# Patient Record
Sex: Female | Born: 1965 | Race: White | Hispanic: No | Marital: Single | Smoking: Never smoker
Health system: Southern US, Community
[De-identification: ages and names within clinical notes are randomized; demographics above are authoritative.]

## PROBLEM LIST (undated history)

## (undated) DIAGNOSIS — J45909 Unspecified asthma, uncomplicated: Secondary | ICD-10-CM

## (undated) DIAGNOSIS — E039 Hypothyroidism, unspecified: Secondary | ICD-10-CM

## (undated) DIAGNOSIS — S63649A Sprain of metacarpophalangeal joint of unspecified thumb, initial encounter: Secondary | ICD-10-CM

## (undated) HISTORY — PX: ANTERIOR CRUCIATE LIGAMENT REPAIR: SHX115

---

## 2013-07-14 ENCOUNTER — Other Ambulatory Visit: Payer: Self-pay

## 2013-07-14 DIAGNOSIS — Z1231 Encounter for screening mammogram for malignant neoplasm of breast: Secondary | ICD-10-CM

## 2013-07-28 ENCOUNTER — Ambulatory Visit: Payer: Self-pay

## 2013-08-10 ENCOUNTER — Ambulatory Visit
Admission: RE | Admit: 2013-08-10 | Discharge: 2013-08-10 | Disposition: A | Discharge status: Home / Self Care | Payer: BC Managed Care – PPO | Source: Ambulatory Visit

## 2013-08-10 DIAGNOSIS — Z1231 Encounter for screening mammogram for malignant neoplasm of breast: Secondary | ICD-10-CM

## 2013-08-19 ENCOUNTER — Other Ambulatory Visit: Payer: Self-pay | Admitting: Family Medicine

## 2013-08-19 DIAGNOSIS — R928 Other abnormal and inconclusive findings on diagnostic imaging of breast: Secondary | ICD-10-CM

## 2013-08-26 ENCOUNTER — Ambulatory Visit
Admission: RE | Admit: 2013-08-26 | Discharge: 2013-08-26 | Disposition: A | Discharge status: Home / Self Care | Payer: BC Managed Care – PPO | Source: Ambulatory Visit | Attending: Family Medicine | Admitting: Family Medicine

## 2013-08-26 DIAGNOSIS — R928 Other abnormal and inconclusive findings on diagnostic imaging of breast: Secondary | ICD-10-CM

## 2014-07-29 ENCOUNTER — Other Ambulatory Visit: Payer: Self-pay

## 2014-07-29 DIAGNOSIS — Z1231 Encounter for screening mammogram for malignant neoplasm of breast: Secondary | ICD-10-CM

## 2014-09-13 ENCOUNTER — Ambulatory Visit
Admission: RE | Admit: 2014-09-13 | Discharge: 2014-09-13 | Disposition: A | Discharge status: Home / Self Care | Payer: BC Managed Care – PPO | Source: Ambulatory Visit

## 2014-09-13 DIAGNOSIS — Z1231 Encounter for screening mammogram for malignant neoplasm of breast: Secondary | ICD-10-CM

## 2015-10-09 ENCOUNTER — Other Ambulatory Visit: Payer: Self-pay | Admitting: Orthopedic Surgery

## 2015-10-09 ENCOUNTER — Encounter (HOSPITAL_BASED_OUTPATIENT_CLINIC_OR_DEPARTMENT_OTHER): Payer: Self-pay | Admitting: *Deleted

## 2015-10-10 ENCOUNTER — Encounter (HOSPITAL_BASED_OUTPATIENT_CLINIC_OR_DEPARTMENT_OTHER): Payer: Self-pay | Admitting: Orthopedic Surgery

## 2015-10-10 ENCOUNTER — Encounter (HOSPITAL_BASED_OUTPATIENT_CLINIC_OR_DEPARTMENT_OTHER)
Admission: RE | Disposition: A | Discharge status: Home / Self Care | Payer: Self-pay | Source: Ambulatory Visit | Attending: Orthopedic Surgery

## 2015-10-10 ENCOUNTER — Ambulatory Visit (HOSPITAL_BASED_OUTPATIENT_CLINIC_OR_DEPARTMENT_OTHER)
Admission: RE | Admit: 2015-10-10 | Discharge: 2015-10-10 | Disposition: A | Discharge status: Home / Self Care | Payer: BC Managed Care – PPO | Source: Ambulatory Visit | Attending: Orthopedic Surgery | Admitting: Orthopedic Surgery

## 2015-10-10 ENCOUNTER — Ambulatory Visit (HOSPITAL_BASED_OUTPATIENT_CLINIC_OR_DEPARTMENT_OTHER): Payer: BC Managed Care – PPO | Admitting: Anesthesiology

## 2015-10-10 DIAGNOSIS — E039 Hypothyroidism, unspecified: Secondary | ICD-10-CM | POA: Diagnosis not present

## 2015-10-10 DIAGNOSIS — Y9301 Activity, walking, marching and hiking: Secondary | ICD-10-CM | POA: Insufficient documentation

## 2015-10-10 DIAGNOSIS — W19XXXA Unspecified fall, initial encounter: Secondary | ICD-10-CM | POA: Insufficient documentation

## 2015-10-10 DIAGNOSIS — S63642A Sprain of metacarpophalangeal joint of left thumb, initial encounter: Secondary | ICD-10-CM | POA: Insufficient documentation

## 2015-10-10 DIAGNOSIS — J45909 Unspecified asthma, uncomplicated: Secondary | ICD-10-CM | POA: Diagnosis not present

## 2015-10-10 DIAGNOSIS — M1990 Unspecified osteoarthritis, unspecified site: Secondary | ICD-10-CM | POA: Diagnosis not present

## 2015-10-10 HISTORY — DX: Hypothyroidism, unspecified: E03.9

## 2015-10-10 HISTORY — DX: Sprain of metacarpophalangeal joint of unspecified thumb, initial encounter: S63.649A

## 2015-10-10 HISTORY — DX: Unspecified asthma, uncomplicated: J45.909

## 2015-10-10 HISTORY — PX: ULNAR COLLATERAL LIGAMENT REPAIR: SHX6159

## 2015-10-10 SURGERY — REPAIR, LIGAMENT, ULNAR COLLATERAL
Anesthesia: Monitor Anesthesia Care | Site: Thumb | Laterality: Left

## 2015-10-10 MED ORDER — MIDAZOLAM HCL 2 MG/2ML IJ SOLN
1.0000 mg | INTRAMUSCULAR | Status: DC | PRN
  Administered 2015-10-10: 2 mg via INTRAVENOUS

## 2015-10-10 MED ORDER — VANCOMYCIN HCL IN DEXTROSE 1-5 GM/200ML-% IV SOLN
1000.0000 mg | INTRAVENOUS | Status: AC
  Administered 2015-10-10: 1000 mg via INTRAVENOUS

## 2015-10-10 MED ORDER — PROPOFOL 500 MG/50ML IV EMUL
INTRAVENOUS | Status: DC | PRN
  Administered 2015-10-10: 100 ug/kg/min via INTRAVENOUS

## 2015-10-10 MED ORDER — LACTATED RINGERS IV SOLN
INTRAVENOUS | Status: DC
  Administered 2015-10-10: 13:00:00 via INTRAVENOUS

## 2015-10-10 MED ORDER — VANCOMYCIN HCL IN DEXTROSE 1-5 GM/200ML-% IV SOLN
INTRAVENOUS | Status: AC
  Filled 2015-10-10: qty 200

## 2015-10-10 MED ORDER — FENTANYL CITRATE (PF) 100 MCG/2ML IJ SOLN
INTRAMUSCULAR | Status: AC
  Filled 2015-10-10: qty 2

## 2015-10-10 MED ORDER — CHLORHEXIDINE GLUCONATE 4 % EX LIQD: 60.0000 mL | Freq: Once | CUTANEOUS | Status: DC

## 2015-10-10 MED ORDER — HYDROCODONE-ACETAMINOPHEN 10-325 MG PO TABS: 1.0000 | ORAL_TABLET | Freq: Four times a day (QID) | ORAL | Status: AC | PRN

## 2015-10-10 MED ORDER — LIDOCAINE HCL (CARDIAC) 20 MG/ML IV SOLN
INTRAVENOUS | Status: DC | PRN
  Administered 2015-10-10: 50 mg via INTRAVENOUS

## 2015-10-10 MED ORDER — ONDANSETRON HCL 4 MG/2ML IJ SOLN
INTRAMUSCULAR | Status: DC | PRN
  Administered 2015-10-10: 4 mg via INTRAVENOUS

## 2015-10-10 MED ORDER — ONDANSETRON HCL 4 MG/2ML IJ SOLN
INTRAMUSCULAR | Status: AC
  Filled 2015-10-10: qty 2

## 2015-10-10 MED ORDER — MEPERIDINE HCL 25 MG/ML IJ SOLN: 6.2500 mg | INTRAMUSCULAR | Status: DC | PRN

## 2015-10-10 MED ORDER — VANCOMYCIN HCL IN DEXTROSE 1-5 GM/200ML-% IV SOLN: 1000.0000 mg | INTRAVENOUS | Status: DC

## 2015-10-10 MED ORDER — LIDOCAINE HCL (CARDIAC) 20 MG/ML IV SOLN
INTRAVENOUS | Status: AC
  Filled 2015-10-10: qty 5

## 2015-10-10 MED ORDER — OXYCODONE HCL 5 MG/5ML PO SOLN: 5.0000 mg | Freq: Once | ORAL | Status: DC | PRN

## 2015-10-10 MED ORDER — DEXAMETHASONE SODIUM PHOSPHATE 10 MG/ML IJ SOLN
INTRAMUSCULAR | Status: DC | PRN
  Administered 2015-10-10: 10 mg via INTRAVENOUS

## 2015-10-10 MED ORDER — BUPIVACAINE-EPINEPHRINE (PF) 0.5% -1:200000 IJ SOLN
INTRAMUSCULAR | Status: DC | PRN
  Administered 2015-10-10: 30 mL via PERINEURAL

## 2015-10-10 MED ORDER — HYDROMORPHONE HCL 1 MG/ML IJ SOLN: 0.2500 mg | INTRAMUSCULAR | Status: DC | PRN

## 2015-10-10 MED ORDER — GLYCOPYRROLATE 0.2 MG/ML IJ SOLN
0.2000 mg | Freq: Once | INTRAMUSCULAR | Status: DC | PRN
Start: 2015-10-10 — End: 2015-10-10

## 2015-10-10 MED ORDER — DIPHENHYDRAMINE HCL 50 MG/ML IJ SOLN
INTRAMUSCULAR | Status: AC
  Filled 2015-10-10: qty 1

## 2015-10-10 MED ORDER — 0.9 % SODIUM CHLORIDE (POUR BTL) OPTIME
TOPICAL | Status: DC | PRN
  Administered 2015-10-10: 150 mL

## 2015-10-10 MED ORDER — OXYCODONE HCL 5 MG PO TABS: 5.0000 mg | ORAL_TABLET | Freq: Once | ORAL | Status: DC | PRN

## 2015-10-10 MED ORDER — MIDAZOLAM HCL 2 MG/2ML IJ SOLN
INTRAMUSCULAR | Status: AC
  Filled 2015-10-10: qty 2

## 2015-10-10 MED ORDER — FENTANYL CITRATE (PF) 100 MCG/2ML IJ SOLN
50.0000 ug | INTRAMUSCULAR | Status: AC | PRN
  Administered 2015-10-10: 100 ug via INTRAVENOUS
  Administered 2015-10-10 (×2): 50 ug via INTRAVENOUS

## 2015-10-10 MED ORDER — PROMETHAZINE HCL 25 MG/ML IJ SOLN: 6.2500 mg | INTRAMUSCULAR | Status: DC | PRN

## 2015-10-10 MED ORDER — SCOPOLAMINE 1 MG/3DAYS TD PT72: 1.0000 | MEDICATED_PATCH | Freq: Once | TRANSDERMAL | Status: DC | PRN

## 2015-10-10 MED ORDER — DIPHENHYDRAMINE HCL 50 MG/ML IJ SOLN
INTRAMUSCULAR | Status: DC | PRN
  Administered 2015-10-10: 25 mg via INTRAVENOUS

## 2015-10-10 SURGICAL SUPPLY — 71 items
ANCHOR JUGGERKNOT 1.0 1DR 2-0 (Anchor) ×3 IMPLANT
BAG DECANTER FOR FLEXI CONT (MISCELLANEOUS) IMPLANT
BLADE MINI RND TIP GREEN BEAV (BLADE) ×3 IMPLANT
BLADE SURG 15 STRL LF DISP TIS (BLADE) ×1 IMPLANT
BLADE SURG 15 STRL SS (BLADE) ×2
BNDG COHESIVE 3X5 TAN STRL LF (GAUZE/BANDAGES/DRESSINGS) ×3 IMPLANT
BNDG ESMARK 4X9 LF (GAUZE/BANDAGES/DRESSINGS) ×3 IMPLANT
BNDG GAUZE ELAST 4 BULKY (GAUZE/BANDAGES/DRESSINGS) ×3 IMPLANT
CHLORAPREP W/TINT 26ML (MISCELLANEOUS) ×3 IMPLANT
CLOSURE WOUND 1/2 X4 (GAUZE/BANDAGES/DRESSINGS) ×1
CORDS BIPOLAR (ELECTRODE) ×3 IMPLANT
COVER BACK TABLE 60X90IN (DRAPES) ×3 IMPLANT
COVER MAYO STAND STRL (DRAPES) ×3 IMPLANT
CUFF TOURNIQUET SINGLE 18IN (TOURNIQUET CUFF) ×3 IMPLANT
DECANTER SPIKE VIAL GLASS SM (MISCELLANEOUS) ×3 IMPLANT
DRAPE EXTREMITY T 121X128X90 (DRAPE) ×3 IMPLANT
DRAPE OEC MINIVIEW 54X84 (DRAPES) ×3 IMPLANT
DRAPE SURG 17X23 STRL (DRAPES) ×3 IMPLANT
GAUZE SPONGE 4X4 12PLY STRL (GAUZE/BANDAGES/DRESSINGS) ×3 IMPLANT
GAUZE XEROFORM 1X8 LF (GAUZE/BANDAGES/DRESSINGS) ×3 IMPLANT
GLOVE BIO SURGEON STRL SZ 6.5 (GLOVE) ×2 IMPLANT
GLOVE BIO SURGEONS STRL SZ 6.5 (GLOVE) ×1
GLOVE BIOGEL PI IND STRL 7.0 (GLOVE) ×2 IMPLANT
GLOVE BIOGEL PI IND STRL 8.5 (GLOVE) ×1 IMPLANT
GLOVE BIOGEL PI INDICATOR 7.0 (GLOVE) ×4
GLOVE BIOGEL PI INDICATOR 8.5 (GLOVE) ×2
GLOVE SURG ORTHO 8.0 STRL STRW (GLOVE) ×3 IMPLANT
GOWN STRL REUS W/ TWL LRG LVL3 (GOWN DISPOSABLE) ×1 IMPLANT
GOWN STRL REUS W/TWL LRG LVL3 (GOWN DISPOSABLE) ×2
GOWN STRL REUS W/TWL XL LVL3 (GOWN DISPOSABLE) ×3 IMPLANT
K-WIRE .035X4 (WIRE) IMPLANT
NDL SAFETY ECLIPSE 18X1.5 (NEEDLE) IMPLANT
NEEDLE FISTULA 1/2 CIRCLE (NEEDLE) IMPLANT
NEEDLE HYPO 18GX1.5 SHARP (NEEDLE)
NEEDLE KEITH (NEEDLE) IMPLANT
NEEDLE KEITH SZ10 STRAIGHT (NEEDLE) IMPLANT
NEEDLE PRECISIONGLIDE 27X1.5 (NEEDLE) IMPLANT
NS IRRIG 1000ML POUR BTL (IV SOLUTION) ×3 IMPLANT
PACK BASIN DAY SURGERY FS (CUSTOM PROCEDURE TRAY) ×3 IMPLANT
PAD CAST 3X4 CTTN HI CHSV (CAST SUPPLIES) ×1 IMPLANT
PAD CAST 4YDX4 CTTN HI CHSV (CAST SUPPLIES) IMPLANT
PADDING CAST ABS 4INX4YD NS (CAST SUPPLIES) ×2
PADDING CAST ABS COTTON 4X4 ST (CAST SUPPLIES) ×1 IMPLANT
PADDING CAST COTTON 3X4 STRL (CAST SUPPLIES) ×2
PADDING CAST COTTON 4X4 STRL (CAST SUPPLIES)
PASSER SUT SWANSON 36MM LOOP (INSTRUMENTS) IMPLANT
SLEEVE SCD COMPRESS KNEE MED (MISCELLANEOUS) ×3 IMPLANT
SLING ARM FOAM STRAP LRG (SOFTGOODS) ×3 IMPLANT
SPLINT PLASTER CAST XFAST 3X15 (CAST SUPPLIES) ×10 IMPLANT
SPLINT PLASTER XTRA FASTSET 3X (CAST SUPPLIES) ×20
STOCKINETTE 4X48 STRL (DRAPES) ×3 IMPLANT
STRIP CLOSURE SKIN 1/2X4 (GAUZE/BANDAGES/DRESSINGS) ×2 IMPLANT
SUT ETHIBOND 3-0 V-5 (SUTURE) IMPLANT
SUT ETHILON 4 0 PS 2 18 (SUTURE) ×3 IMPLANT
SUT FIBERWIRE 2-0 18 17.9 3/8 (SUTURE)
SUT FIBERWIRE 3-0 18 TAPR NDL (SUTURE)
SUT MERSILENE 2.0 SH NDLE (SUTURE) IMPLANT
SUT MERSILENE 4 0 P 3 (SUTURE) ×3 IMPLANT
SUT MERSILENE 6 0 P 1 (SUTURE) IMPLANT
SUT MON AB 4-0 PC3 18 (SUTURE) ×3 IMPLANT
SUT SILK 4 0 PS 2 (SUTURE) IMPLANT
SUT STEEL 3 0 (SUTURE) IMPLANT
SUT STEEL 4 0 (SUTURE) IMPLANT
SUT STEEL 4 0 V 26 (SUTURE) IMPLANT
SUT VICRYL 4-0 PS2 18IN ABS (SUTURE) IMPLANT
SUTURE FIBERWR 2-0 18 17.9 3/8 (SUTURE) IMPLANT
SUTURE FIBERWR 3-0 18 TAPR NDL (SUTURE) IMPLANT
SYR BULB 3OZ (MISCELLANEOUS) ×3 IMPLANT
SYR CONTROL 10ML LL (SYRINGE) ×3 IMPLANT
TOWEL OR 17X24 6PK STRL BLUE (TOWEL DISPOSABLE) ×6 IMPLANT
UNDERPAD 30X30 (UNDERPADS AND DIAPERS) ×3 IMPLANT

## 2015-10-10 NOTE — Brief Op Note (Signed)
10/10/2015  2:30 PM  PATIENT:  [name] IsaacKathryn Norton  49 y.o. female  PRE-OPERATIVE DIAGNOSIS:  RUPTURE ULNAR COLLATERAL LIGAMENT METACARPAL PHALANGEAL  POST-OPERATIVE DIAGNOSIS:  RUPTURE ULNAR COLLATERAL LIGAMENT METACARPAL PHALANGEAL  PROCEDURE:  Procedure(s): REPAIR ULNAR COLLATERAL LIGAMENT  METACARPAL PHALANGEAL LEFT THUMB (Left)  SURGEON:  Surgeon(s) and Role:    * Cindee SaltGary Jomarion Mish, MD - Primary  PHYSICIAN ASSISTANT:   ASSISTANTS: none   ANESTHESIA:   regional and IV sedation  EBL:  Total I/O In: 600 [I.V.:600] Out: -   BLOOD ADMINISTERED:none  DRAINS: none   LOCAL MEDICATIONS USED:  NONE  SPECIMEN:  No Specimen  DISPOSITION OF SPECIMEN:  N/A  COUNTS:  YES  TOURNIQUET:   Total Tourniquet Time Documented: Upper Arm (Left) - 19 minutes Total: Upper Arm (Left) - 19 minutes   DICTATION: .Other Dictation: Dictation Number [id number]  PLAN OF CARE: Discharge to home after PACU  PATIENT DISPOSITION:  PACU - hemodynamically stable.

## 2015-10-10 NOTE — Progress Notes (Signed)
Assisted Dr. Germeroth with left, ultrasound guided, axillary block. Side rails up, monitors on throughout procedure. See vital signs in flow sheet. Tolerated Procedure well. 

## 2015-10-10 NOTE — Discharge Instructions (Addendum)

## 2015-10-10 NOTE — Anesthesia Procedure Notes (Signed)
Anesthesia Regional Block:  Axillary brachial plexus block  Pre-Anesthetic Checklist: ,, timeout performed, Correct Patient, Correct Site, Correct Laterality, Correct Procedure, Correct Position, site marked, Risks and benefits discussed, Surgical consent,  Pre-op evaluation,  Post-op pain management  Laterality: Left  Prep: chloraprep       Needles:  Injection technique: Single-shot  Needle Type: Stimulator Needle - 40     Needle Length: 4cm 4 cm Needle Gauge: 22 and 22 G    Additional Needles:  Procedures: ultrasound guided (picture in chart) Axillary brachial plexus block Narrative:  Injection made incrementally with aspirations every 5 mL.  Performed by: Personally  Anesthesiologist: Lewie LoronGERMEROTH, Adara Kittle  Additional Notes: BP cuff, EKG monitors applied. Sedation begun. Nerve location verified with U/S. Anesthetic injected incrementally, slowly , and after neg aspirations under direct u/s guidance. Good perineural spread. Tolerated well.

## 2015-10-10 NOTE — Op Note (Addendum)
Dictation Number [id number] Intra-operative fluoroscopic images in the AP, lateral, and oblique views were taken and evaluated by myself.  Stress views showed no instability in flexion or extension

## 2015-10-10 NOTE — H&P (Signed)
[name] Norton Pescador is a 49 year old right hand dominant female who suffered a fall injuring her left thumb while hiking on 10-02-15. She is referred by Dr. Maurice SmallElaine Griffin. She states that her thumb was pushed at right angles to her hand towards the thumb side. She has no prior history of injury. She states that she pushed this back into position and taped it. She complains of intermittent, mild, throbbing pain with a feeling of swelling. She states any time she attempts to pinch it is painful and she can push it off. She says it is getting worse. Activity makes it worse. She has a history of thyroid problems and arthritis in her knee, otherwise negative for diabetes and gout. She has been taking ibuprofen for pain.  She has had her MRI done.  This reveals that she indeed has an ulnar collateral ligament repair, but they feel that the majority of it is underneath the adductor.  There does appear to be portions which are outside the adductor.   PAST MEDICAL HISTORY:  She is allergic to Cefaclor. She is on Atenolol, Levothyroxine. She had [place] Biochem[place]  FAMILY MEDICAL HISTORY: Positive for heart disease, otherwise negative.  SOCIAL HISTORY:  She does not smoke. She drinks socially. 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  REVIEW OF SYSTEMS: Positive for asthma, otherwise negative 14 points. [name] Norton Norton is an 49 y.o. female.   Chief Complaint: ruptyure UCL MCP left thumb HPI: see vabove  Past Medical History  Diagnosis Date  . Asthma   . Hypothyroidism   . Rupture of ulnar collateral ligament (UCL) of thumb     left    Past Surgical History  Procedure Laterality Date  . Anterior cruciate ligament repair Right     History reviewed. No pertinent family history. Social History:  reports that she has never smoked. She does not have any smokeless tobacco history on file. She reports that she drinks alcohol. Her drug history is not on file.  Allergies:  Allergies  Allergen  Reactions  . Cefaclor     No prescriptions prior to admission    No results found for this or any previous visit (from the past 48 hour(s)).  No results found.   Pertinent items are noted in HPI.  Height 5\' 3"  (1.6 m), weight 86.183 kg (190 lb), last menstrual period 09/25/2015.  General appearance: alert, cooperative and appears stated age Head: Normocephalic, without obvious abnormality Neck: no JVD Resp: clear to auscultation bilaterally Cardio: regular rate and rhythm, S1, S2 normal, no murmur, click, rub or gallop GI: soft, non-tender; bowel sounds normal; no masses,  no organomegaly Extremities: instability MCP left thumb Pulses: 2+ and symmetric Skin: Skin color, texture, turgor normal. No rashes or lesions Neurologic: Grossly normal Incision/Wound: na  Assessment/Plan X-rays reveal no fractures.  DIAGNOSIS: Ulnar collateral ligament injury MCP joint left thumb  PLAN: We have had a long discussion with respect to repair of this at this point in time vs. conservative treatment.  The possibility of repair having to be done in the future should this not fully heal. The benefits and risks of each are taken. She is aware of the added healing time required for reconstruction vs. one healing time with repair at this point in time. She states that she would prefer to have this operated on  now rather than taking the chance of having it not heal properly and having to be operated on in the future with reconstruction  and/or repair.    The pre, peri and postoperative course were discussed along with the risks and complications.  The patient is aware there is no guarantee with the surgery, possibility of infection, recurrence, injury to arteries, nerves, tendons, incomplete relief of symptoms and dystrophy.   She states she has had an ACL repair in the past and knows what's involved with the healing process.  This has helped her make the decision. She is scheduled for repair ulnar  collateral ligament metacarpophalangeal joint left thumb as an outpatient under regional anesthesia.Cindee Salt R 10/10/2015, 12:17 PM

## 2015-10-10 NOTE — Anesthesia Postprocedure Evaluation (Signed)
Anesthesia Post Note  Patient: [name] Norton  Procedure(s) Performed: Procedure(s) (LRB): REPAIR ULNAR COLLATERAL LIGAMENT  METACARPAL PHALANGEAL LEFT THUMB (Left)  Anesthesia type: MAC + axillary block  Patient location: PACU  Post pain: Pain level controlled  Post assessment: Post-op Vital signs reviewed  Last Vitals: BP 106/68 mmHg  Pulse 48  Temp(Src) 36.8 C (Oral)  Resp 20  Ht 5\' 3"  (1.6 m)  Wt 192 lb (87.091 kg)  BMI 34.02 kg/m2  SpO2 100%  LMP 09/25/2015 (Approximate)  Post vital signs: Reviewed  Level of consciousness: awake  Complications: No apparent anesthesia complications

## 2015-10-10 NOTE — Anesthesia Preprocedure Evaluation (Signed)
Anesthesia Evaluation  Patient identified by MRN, date of birth, ID band Patient awake    Reviewed: Allergy & Precautions, NPO status , Patient's Chart, lab work & pertinent test results, reviewed documented beta blocker date and time   History of Anesthesia Complications (+) PONV  Airway Mallampati: II  TM Distance: >3 FB Neck ROM: Full    Dental no notable dental hx.    Pulmonary asthma ,    Pulmonary exam normal breath sounds clear to auscultation       Cardiovascular Pt. on home beta blockers negative cardio ROS Normal cardiovascular exam Rhythm:Regular Rate:Normal     Neuro/Psych negative neurological ROS  negative psych ROS   GI/Hepatic negative GI ROS, Neg liver ROS,   Endo/Other  Hypothyroidism   Renal/GU negative Renal ROS     Musculoskeletal negative musculoskeletal ROS (+)   Abdominal   Peds  Hematology negative hematology ROS (+)   Anesthesia Other Findings   Reproductive/Obstetrics negative OB ROS                             Anesthesia Physical Anesthesia Plan  ASA: II  Anesthesia Plan: Regional and MAC   Post-op Pain Management:    Induction: Intravenous  Airway Management Planned:   Additional Equipment:   Intra-op Plan:   Post-operative Plan:   Informed Consent: I have reviewed the patients History and Physical, chart, labs and discussed the procedure including the risks, benefits and alternatives for the proposed anesthesia with the patient or authorized representative who has indicated his/her understanding and acceptance.   Dental advisory given  Plan Discussed with: CRNA  Anesthesia Plan Comments:         Anesthesia Quick Evaluation

## 2015-10-10 NOTE — Op Note (Signed)
NAMKatherene Norton:  Norton, [name]             ACCOUNT NO.:  [id number]  MEDICAL RECORD NO.:  [id number]  LOCATION:                                 FACILITY:  PHYSICIAN:  [name] Norton, M.D.            DATE OF BIRTH:  DATE OF PROCEDURE:  10/10/2015 DATE OF DISCHARGE:                              OPERATIVE REPORT   PREOPERATIVE DIAGNOSIS:  Ulnar collateral ligament tear, metacarpophalangeal joint, left thumb.  POSTOPERATIVE DIAGNOSIS:  Ulnar collateral ligament tear, metacarpophalangeal joint, left thumb with Stener lesion.  OPERATION:  Repair of ulnar collateral ligament, metacarpophalangeal joint, left thumb.  SURGEON:  [name] SaltGary Jamyla Norton, M.D.  ANESTHESIA:  Supraclavicular block.  ANESTHESIOLOGIST:  [name] RichAdam Hodierne, MD  HISTORY:  The patient is a 49 year old female, who suffered an injury to the ulnar collateral ligament, metacarpophalangeal joint of her left thumb.  She complains of pain and discomfort, this occurred while hiking in a fall.  MRI reveals a partially intact ligament, palpation reveals a probable Sterner lesion.  She has elected to undergo repair, being aware of the potential for healing if the ligament is actually underneath the adductor aponeurosis, but she has elected to have this explored and repaired as dictated by findings.  During the surgery, pre, peri, and postoperative courses have been discussed along with risks and complications.  She is aware there is no guarantee with surgery; possibility of infection; recurrence of injury to arteries, nerves, tendons; incomplete relief of symptoms; dystrophy.  In the preoperative area, the patient is seen, the extremity marked by both patient and surgeon.  Antibiotic given.  DESCRIPTION OF PROCEDURE:  The patient was brought to the operating room where a supraclavicular block was carried out in the preoperative area. She was sedated, prepped using ChloraPrep, supine position with left arm free.  A 3-minute dry time was allowed.   Time-out taken, confirming the patient and procedure.  The limb was exsanguinated with an Esmarch bandage.  Tourniquet placed high on the arm was inflated to 250 mmHg.  A curvilinear incision was made over the metacarpophalangeal joint, ulnar aspect, left thumb, carried down through the subcutaneous tissue. Dorsal sensory branch of the nerve was identified.  The subcutaneous tissue was spread and an obvious Stener lesion was immediately visible. The adductor aponeurosis was incised longitudinally, this revealed the old attachment.  The entire ligament was detached and outside the adductor aponeurosis.  The area of bone was prepared.  A JuggerKnot anchor was then placed after drilling hole in the base of the proximal phalanx proximally.  The JuggerKnot instilled, this was then put under traction and no loosening was noted.  The ligament was then repaired back down to its original position using the JuggerKnot suture.  The wound was irrigated.  The adductor aponeurosis was closed with running 4- 0 Mersilene suture.  The skin was then closed with subcuticular 4-0 Monocryl suture.  Benzoin and Steri-Strips were applied.  Sterile compressive dressing, thumb spica splint applied prior to closure.  A stress view was performed confirming that there was no opening with stress on the ulnar collateral ligament with good stability in both flexion and extension. Full mobility was present to the profundus  tendon.  The patient tolerated the procedure well and was taken to the recovery room for observation after deflation of the tourniquet.  It was noted to have all fingers revascularized.          ______________________________ [name] Salt, M.D.     GK/MEDQ  D:  10/10/2015  T:  10/10/2015  Job:  [id number]

## 2015-10-10 NOTE — Transfer of Care (Signed)
Immediate Anesthesia Transfer of Care Note  Patient: [name] Norton  Procedure(s) Performed: Procedure(s): REPAIR ULNAR COLLATERAL LIGAMENT  METACARPAL PHALANGEAL LEFT THUMB (Left)  Patient Location: PACU  Anesthesia Type:MAC and MAC combined with regional for post-op pain  Level of Consciousness: awake, alert  and oriented  Airway & Oxygen Therapy: Patient Spontanous Breathing and Patient connected to face mask oxygen  Post-op Assessment: Report given to RN and Post -op Vital signs reviewed and stable  Post vital signs: Reviewed and stable  Last Vitals:  Filed Vitals:   10/10/15 1342  BP:   Pulse: 56  Resp: 14    Complications: No apparent anesthesia complications

## 2015-10-11 ENCOUNTER — Encounter (HOSPITAL_BASED_OUTPATIENT_CLINIC_OR_DEPARTMENT_OTHER): Payer: Self-pay | Admitting: Orthopedic Surgery

## 2015-12-08 DIAGNOSIS — S63642A Sprain of metacarpophalangeal joint of left thumb, initial encounter: Secondary | ICD-10-CM | POA: Insufficient documentation

## 2015-12-24 HISTORY — PX: BREAST BIOPSY: SHX20

## 2016-01-12 ENCOUNTER — Other Ambulatory Visit: Payer: Self-pay

## 2016-01-12 ENCOUNTER — Other Ambulatory Visit: Payer: Self-pay | Admitting: Family Medicine

## 2016-01-12 ENCOUNTER — Other Ambulatory Visit (HOSPITAL_COMMUNITY)
Admission: RE | Admit: 2016-01-12 | Discharge: 2016-01-12 | Disposition: A | Discharge status: Home / Self Care | Payer: BC Managed Care – PPO | Source: Ambulatory Visit | Attending: Family Medicine | Admitting: Family Medicine

## 2016-01-12 DIAGNOSIS — Z124 Encounter for screening for malignant neoplasm of cervix: Secondary | ICD-10-CM | POA: Insufficient documentation

## 2016-01-12 DIAGNOSIS — Z1151 Encounter for screening for human papillomavirus (HPV): Secondary | ICD-10-CM | POA: Diagnosis not present

## 2016-01-12 DIAGNOSIS — Z1231 Encounter for screening mammogram for malignant neoplasm of breast: Secondary | ICD-10-CM

## 2016-01-16 LAB — CYTOLOGY - PAP

## 2016-01-29 ENCOUNTER — Ambulatory Visit
Admission: RE | Admit: 2016-01-29 | Discharge: 2016-01-29 | Disposition: A | Discharge status: Home / Self Care | Payer: BC Managed Care – PPO | Source: Ambulatory Visit

## 2016-01-29 DIAGNOSIS — Z1231 Encounter for screening mammogram for malignant neoplasm of breast: Secondary | ICD-10-CM

## 2016-02-01 ENCOUNTER — Other Ambulatory Visit: Payer: Self-pay | Admitting: Family Medicine

## 2016-02-01 DIAGNOSIS — R928 Other abnormal and inconclusive findings on diagnostic imaging of breast: Secondary | ICD-10-CM

## 2016-02-06 ENCOUNTER — Ambulatory Visit
Admission: RE | Admit: 2016-02-06 | Discharge: 2016-02-06 | Disposition: A | Discharge status: Home / Self Care | Payer: BC Managed Care – PPO | Source: Ambulatory Visit | Attending: Family Medicine | Admitting: Family Medicine

## 2016-02-06 DIAGNOSIS — R928 Other abnormal and inconclusive findings on diagnostic imaging of breast: Secondary | ICD-10-CM

## 2016-07-17 ENCOUNTER — Other Ambulatory Visit: Payer: Self-pay | Admitting: Family Medicine

## 2016-07-17 DIAGNOSIS — N6320 Unspecified lump in the left breast, unspecified quadrant: Secondary | ICD-10-CM

## 2016-08-06 ENCOUNTER — Ambulatory Visit
Admission: RE | Admit: 2016-08-06 | Discharge: 2016-08-06 | Disposition: A | Discharge status: Home / Self Care | Payer: BC Managed Care – PPO | Source: Ambulatory Visit | Attending: Family Medicine | Admitting: Family Medicine

## 2016-08-06 ENCOUNTER — Other Ambulatory Visit (HOSPITAL_COMMUNITY)
Admission: RE | Admit: 2016-08-06 | Discharge: 2016-08-06 | Disposition: A | Discharge status: Home / Self Care | Payer: BC Managed Care – PPO | Source: Ambulatory Visit | Attending: Family Medicine | Admitting: Family Medicine

## 2016-08-06 ENCOUNTER — Other Ambulatory Visit: Payer: Self-pay | Admitting: Family Medicine

## 2016-08-06 DIAGNOSIS — N6320 Unspecified lump in the left breast, unspecified quadrant: Secondary | ICD-10-CM

## 2016-08-06 DIAGNOSIS — Z1151 Encounter for screening for human papillomavirus (HPV): Secondary | ICD-10-CM | POA: Diagnosis not present

## 2016-08-06 DIAGNOSIS — Z01411 Encounter for gynecological examination (general) (routine) with abnormal findings: Secondary | ICD-10-CM | POA: Diagnosis present

## 2016-08-08 ENCOUNTER — Other Ambulatory Visit: Payer: Self-pay | Admitting: Family Medicine

## 2016-08-08 ENCOUNTER — Ambulatory Visit
Admission: RE | Admit: 2016-08-08 | Discharge: 2016-08-08 | Disposition: A | Discharge status: Home / Self Care | Payer: BC Managed Care – PPO | Source: Ambulatory Visit | Attending: Family Medicine | Admitting: Family Medicine

## 2016-08-08 DIAGNOSIS — N6320 Unspecified lump in the left breast, unspecified quadrant: Secondary | ICD-10-CM

## 2016-08-08 LAB — CYTOLOGY - PAP

## 2016-09-12 ENCOUNTER — Other Ambulatory Visit: Payer: Self-pay | Admitting: Nurse Practitioner

## 2017-01-20 ENCOUNTER — Other Ambulatory Visit (HOSPITAL_COMMUNITY)
Admission: RE | Admit: 2017-01-20 | Discharge: 2017-01-20 | Disposition: A | Discharge status: Home / Self Care | Payer: BC Managed Care – PPO | Source: Ambulatory Visit | Attending: Family Medicine | Admitting: Family Medicine

## 2017-01-20 ENCOUNTER — Other Ambulatory Visit: Payer: Self-pay | Admitting: Family Medicine

## 2017-01-20 DIAGNOSIS — Z0142 Encounter for cervical smear to confirm findings of recent normal smear following initial abnormal smear: Secondary | ICD-10-CM | POA: Diagnosis present

## 2017-01-20 DIAGNOSIS — Z1231 Encounter for screening mammogram for malignant neoplasm of breast: Secondary | ICD-10-CM

## 2017-01-24 LAB — CYTOLOGY - PAP: DIAGNOSIS: NEGATIVE

## 2017-02-11 ENCOUNTER — Ambulatory Visit
Admission: RE | Admit: 2017-02-11 | Discharge: 2017-02-11 | Disposition: A | Discharge status: Home / Self Care | Payer: BC Managed Care – PPO | Source: Ambulatory Visit | Attending: Family Medicine | Admitting: Family Medicine

## 2017-02-11 DIAGNOSIS — Z1231 Encounter for screening mammogram for malignant neoplasm of breast: Secondary | ICD-10-CM

## 2018-01-22 ENCOUNTER — Other Ambulatory Visit: Payer: Self-pay | Admitting: Family Medicine

## 2018-01-22 ENCOUNTER — Other Ambulatory Visit (HOSPITAL_COMMUNITY)
Admission: RE | Admit: 2018-01-22 | Discharge: 2018-01-22 | Disposition: A | Discharge status: Home / Self Care | Payer: BC Managed Care – PPO | Source: Ambulatory Visit | Attending: Family Medicine | Admitting: Family Medicine

## 2018-01-22 DIAGNOSIS — Z01419 Encounter for gynecological examination (general) (routine) without abnormal findings: Secondary | ICD-10-CM | POA: Diagnosis present

## 2018-01-22 DIAGNOSIS — Z1231 Encounter for screening mammogram for malignant neoplasm of breast: Secondary | ICD-10-CM

## 2018-01-27 LAB — CYTOLOGY - PAP: Diagnosis: NEGATIVE

## 2018-01-30 ENCOUNTER — Other Ambulatory Visit: Payer: Self-pay

## 2018-01-30 ENCOUNTER — Other Ambulatory Visit (HOSPITAL_COMMUNITY): Payer: Self-pay

## 2018-02-12 ENCOUNTER — Ambulatory Visit
Admission: RE | Admit: 2018-02-12 | Discharge: 2018-02-12 | Disposition: A | Discharge status: Home / Self Care | Payer: BC Managed Care – PPO | Source: Ambulatory Visit | Attending: Family Medicine | Admitting: Family Medicine

## 2018-02-12 DIAGNOSIS — Z1231 Encounter for screening mammogram for malignant neoplasm of breast: Secondary | ICD-10-CM

## 2018-05-01 ENCOUNTER — Ambulatory Visit: Payer: BC Managed Care – PPO | Admitting: Podiatry

## 2018-05-01 ENCOUNTER — Ambulatory Visit (INDEPENDENT_AMBULATORY_CARE_PROVIDER_SITE_OTHER): Payer: BC Managed Care – PPO

## 2018-05-01 ENCOUNTER — Other Ambulatory Visit: Payer: Self-pay

## 2018-05-01 ENCOUNTER — Encounter: Payer: Self-pay | Admitting: Podiatry

## 2018-05-01 ENCOUNTER — Other Ambulatory Visit: Payer: Self-pay | Admitting: Podiatry

## 2018-05-01 VITALS — BP 123/76 | HR 69

## 2018-05-01 DIAGNOSIS — M722 Plantar fascial fibromatosis: Secondary | ICD-10-CM

## 2018-05-01 DIAGNOSIS — M79671 Pain in right foot: Secondary | ICD-10-CM

## 2018-05-01 DIAGNOSIS — M79672 Pain in left foot: Secondary | ICD-10-CM | POA: Diagnosis not present

## 2018-05-01 DIAGNOSIS — E079 Disorder of thyroid, unspecified: Secondary | ICD-10-CM | POA: Insufficient documentation

## 2018-05-01 DIAGNOSIS — M1710 Unilateral primary osteoarthritis, unspecified knee: Secondary | ICD-10-CM | POA: Insufficient documentation

## 2018-05-01 MED ORDER — MELOXICAM 15 MG PO TABS: 15.0000 mg | ORAL_TABLET | Freq: Every day | ORAL | 1 refills | Status: DC

## 2018-05-01 MED ORDER — TRIAMCINOLONE ACETONIDE 10 MG/ML IJ SUSP
10.0000 mg | Freq: Once | INTRAMUSCULAR | Status: AC
  Administered 2018-05-01: 10 mg

## 2018-05-01 NOTE — Patient Instructions (Addendum)

## 2018-05-03 DIAGNOSIS — M722 Plantar fascial fibromatosis: Secondary | ICD-10-CM | POA: Insufficient documentation

## 2018-05-03 NOTE — Progress Notes (Signed)
Subjective:   Patient ID: [name] Norton, female   DOB: 52 y.o.   MRN: [id number]   HPI Teliah presents the office with concerns of bilateral heel pain for the left side worse than the right.  She states that pain is becoming more consistent she discussed a sharp pain to the bottom of her heel is been ongoing for about 3 months has been getting worse.  She denies any recent injury or trauma.  She is tried stretching at home without any significant improvement.  There is no numbness or tingling.  The pain does not wake her up at night.  She has no other concerns today.   Review of Systems  All other systems reviewed and are negative.  Past Medical History:  Diagnosis Date  . Asthma   . Hypothyroidism   . Rupture of ulnar collateral ligament (UCL) of thumb    left    Past Surgical History:  Procedure Laterality Date  . ANTERIOR CRUCIATE LIGAMENT REPAIR Right   . BREAST BIOPSY Left 2017  . ULNAR COLLATERAL LIGAMENT REPAIR Left 10/10/2015   Procedure: REPAIR ULNAR COLLATERAL LIGAMENT  METACARPAL PHALANGEAL LEFT THUMB;  Surgeon: Cindee Salt, MD;  Location: [facility];  Service: Orthopedics;  Laterality: Left;     Current Outpatient Medications:  .  albuterol (PROVENTIL HFA;VENTOLIN HFA) 108 (90 BASE) MCG/ACT inhaler, Inhale into the lungs every 6 (six) hours as needed for wheezing or shortness of breath., Disp: , Rfl:  .  atenolol (TENORMIN) 25 MG tablet, Take by mouth daily., Disp: , Rfl:  .  HYDROcodone-acetaminophen (NORCO) 10-325 MG tablet, Take 1 tablet by mouth every 6 (six) hours as needed., Disp: 30 tablet, Rfl: 0 .  levothyroxine (SYNTHROID, LEVOTHROID) 75 MCG tablet, Take 0.075 mcg by mouth daily before breakfast., Disp: , Rfl:  .  meloxicam (MOBIC) 15 MG tablet, TAKE 1 TABLET BY MOUTH EVERY DAY, Disp: 90 tablet, Rfl: 1 .  PREVIDENT 5000 BOOSTER PLUS 1.1 % PSTE, APPLY A PEA SIZE AMOUNT OF PASTE ONTO BRUSH AND APPLY TO ALL SURFACES OF TEETH FOR 2 MINS ONCE D AND  EXPECTORATE, Disp: , Rfl: 4  Allergies  Allergen Reactions  . Cefaclor     Social History   Socioeconomic History  . Marital status: Single    Spouse name: Not on file  . Number of children: Not on file  . Years of education: Not on file  . Highest education level: Not on file  Occupational History  . Not on file  Social Needs  . Financial resource strain: Not on file  . Food insecurity:    Worry: Not on file    Inability: Not on file  . Transportation needs:    Medical: Not on file    Non-medical: Not on file  Tobacco Use  . Smoking status: Never Smoker  . Smokeless tobacco: Never Used  Substance and Sexual Activity  . Alcohol use: Yes    Comment: social  . Drug use: Not on file  . Sexual activity: Not on file  Lifestyle  . Physical activity:    Days per week: Not on file    Minutes per session: Not on file  . Stress: Not on file  Relationships  . Social connections:    Talks on phone: Not on file    Gets together: Not on file    Attends religious service: Not on file    Active member of club or organization: Not on file    Attends  meetings of clubs or organizations: Not on file    Relationship status: Not on file  . Intimate partner violence:    Fear of current or ex partner: Not on file    Emotionally abused: Not on file    Physically abused: Not on file    Forced sexual activity: Not on file  Other Topics Concern  . Not on file  Social History Narrative  . Not on file        Objective:  Physical Exam  General: AAO x3, NAD  Dermatological: Skin is warm, dry and supple bilateral. Nails x 10 are well manicured; remaining integument appears unremarkable at this time. There are no open sores, no preulcerative lesions, no rash or signs of infection present.  Vascular: Dorsalis Pedis artery and Posterior Tibial artery pedal pulses are 2/4 bilateral with immedate capillary fill time. Pedal hair growth present. No varicosities and no lower extremity edema  present bilateral. There is no pain with calf compression, swelling, warmth, erythema.   Neruologic: Grossly intact via light touch bilateral.  Protective threshold with Semmes Wienstein monofilament intact to all pedal sites bilateral.  Negative Tinel sign b/l.  Musculoskeletal: Tenderness to palpation along the plantar medial tubercle of the calcaneus at the insertion of plantar fascia on the left > right foot. There is no pain along the course of the plantar fascia within the arch of the foot. Plantar fascia appears to be intact. There is no pain with lateral compression of the calcaneus or pain with vibratory sensation. There is no pain along the course or insertion of the achilles tendon. No other areas of tenderness to bilateral lower extremities. Muscular strength 5/5 in all groups tested bilateral.  Gait: Unassisted, Nonantalgic.       Assessment:   52 year old female with bilateral heel pain likely plantar fasciitis left side worse than right    Plan:  -Treatment options discussed including all alternatives, risks, and complications -Etiology of symptoms were discussed -X-rays were obtained and reviewed with the patient. No evidence of acute fracture or stress fracture identified today. -Steroid injections were performed.  See procedure note below. -Plantar fascial brace is dispensed bilaterally -Prescribed mobic. Discussed side effects of the medication and directed to stop if any are to occur and call the office.  -Stretching, icing exercises daily -Discussed shoe modifications and orthotics -Follow-up in 3 weeks if symptoms continue or sooner if any issues are to arise.  Vivi Barrack DPM

## 2019-01-25 ENCOUNTER — Other Ambulatory Visit: Payer: Self-pay | Admitting: Family Medicine

## 2019-01-25 DIAGNOSIS — Z1231 Encounter for screening mammogram for malignant neoplasm of breast: Secondary | ICD-10-CM

## 2019-03-02 ENCOUNTER — Ambulatory Visit
Admission: RE | Admit: 2019-03-02 | Discharge: 2019-03-02 | Disposition: A | Discharge status: Home / Self Care | Payer: BC Managed Care – PPO | Source: Ambulatory Visit | Attending: Family Medicine | Admitting: Family Medicine

## 2019-03-02 DIAGNOSIS — Z1231 Encounter for screening mammogram for malignant neoplasm of breast: Secondary | ICD-10-CM

## 2019-05-05 IMAGING — MG DIGITAL SCREENING BILATERAL MAMMOGRAM WITH TOMO AND CAD
8 series · 8 of 24 positions shown · non-contrast
Comparison: Previous exam(s).

CLINICAL DATA: Screening.

EXAM:
DIGITAL SCREENING BILATERAL MAMMOGRAM WITH TOMO AND CAD

[L CC synth-2D]
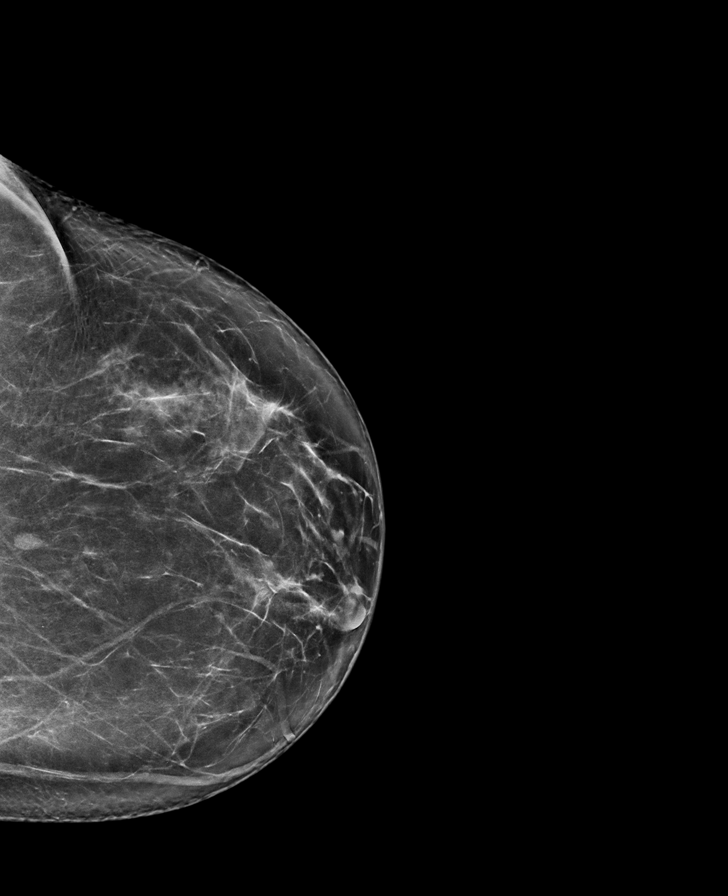

[R MLO synth-2D]
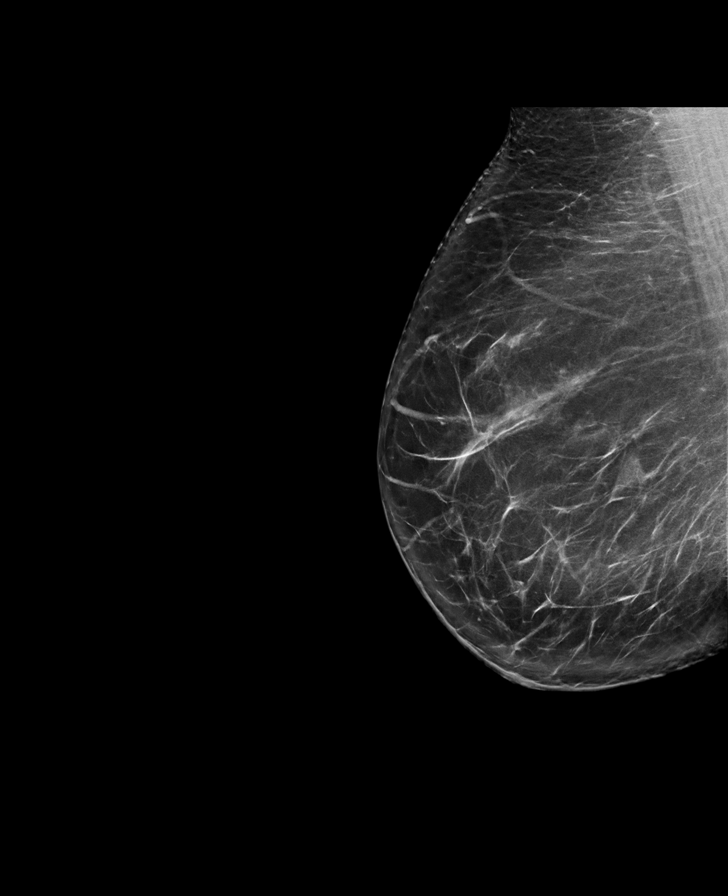

[R CC synth-2D]
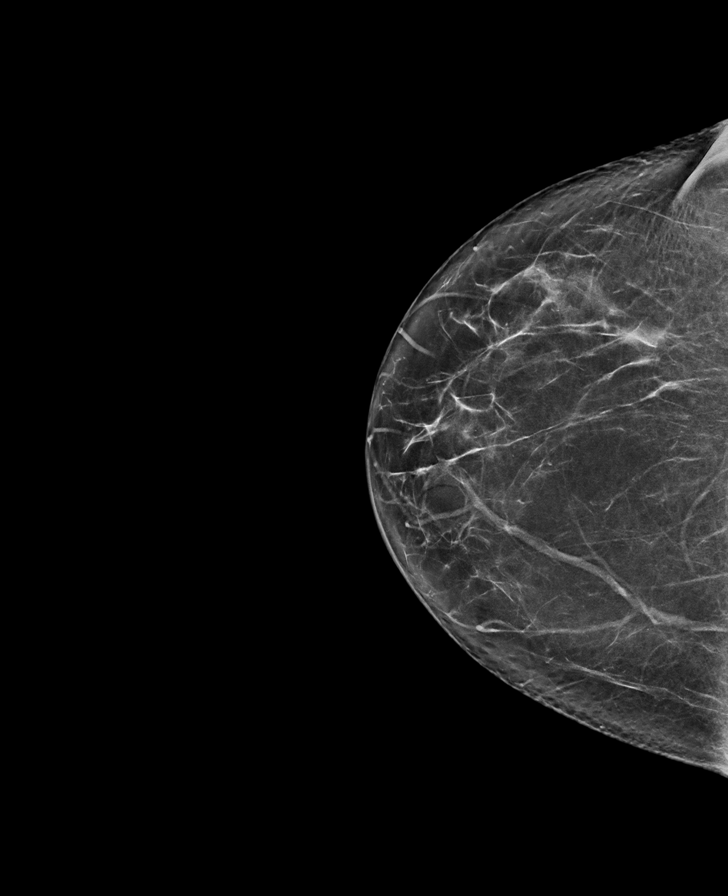

[L MLO synth-2D]
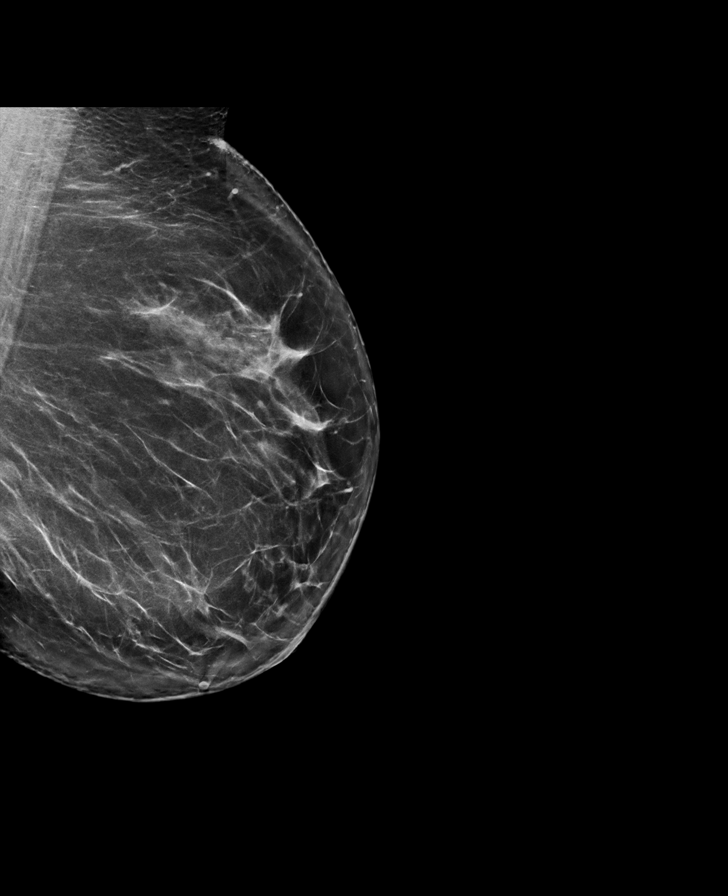

[R MLO tomo · tomo slice 44/87.0]
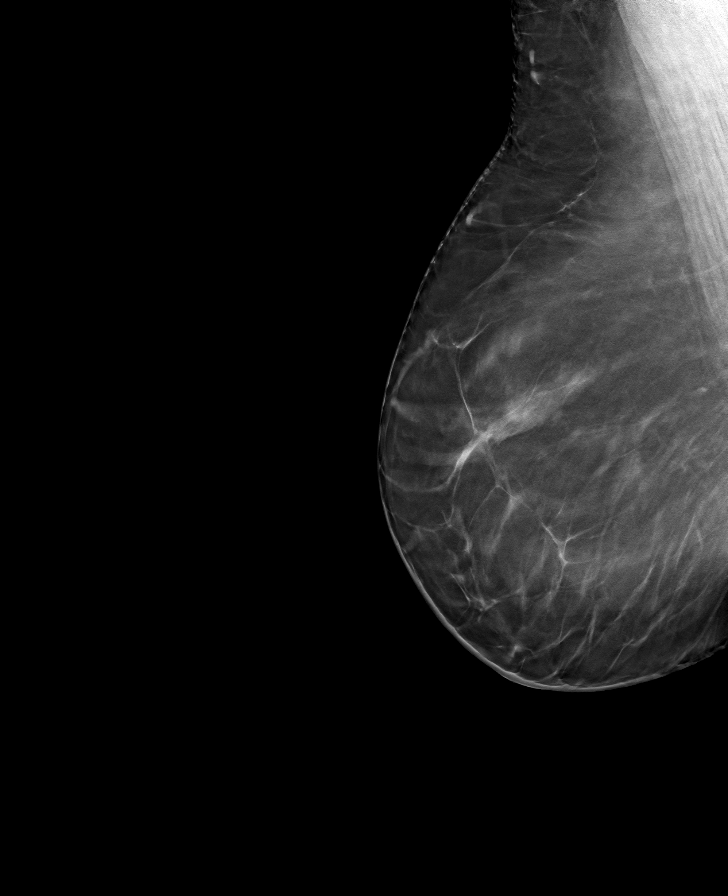

[L CC tomo · tomo slice 41/82.0]
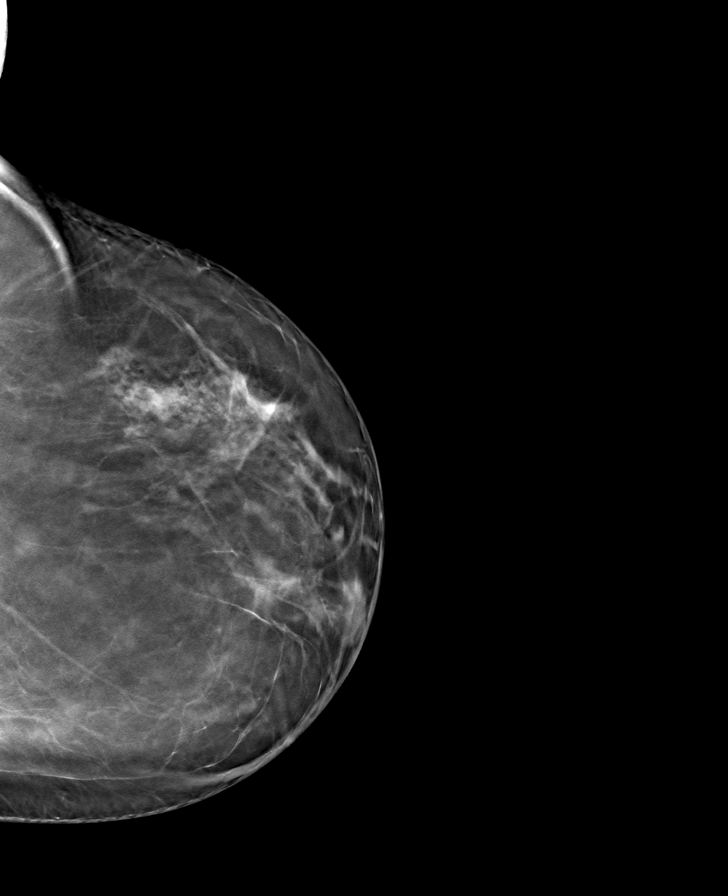

[L MLO tomo · tomo slice 45/89.0]
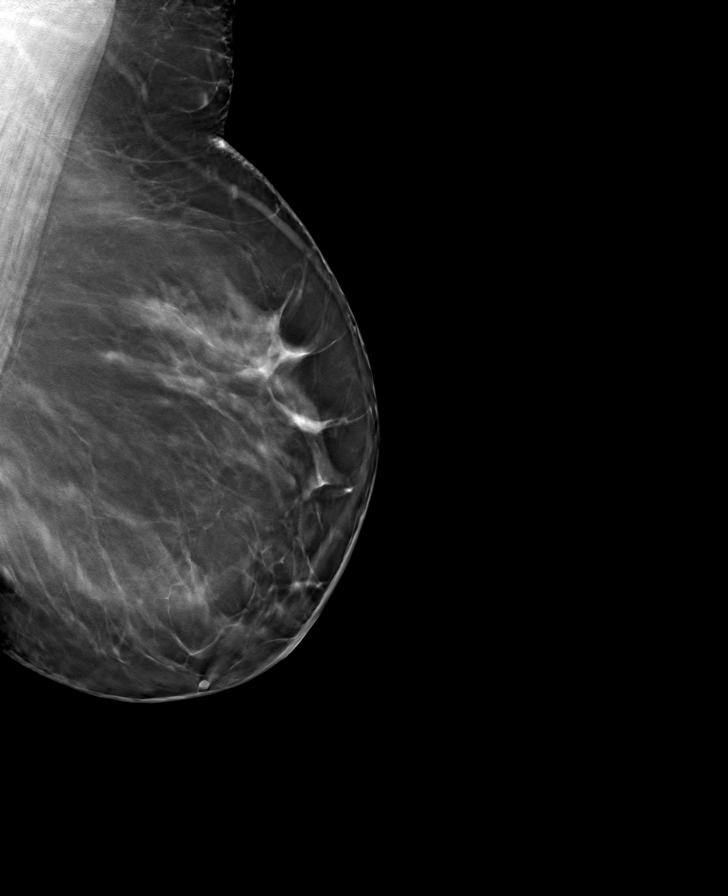

[R CC tomo · tomo slice 39/78.0]
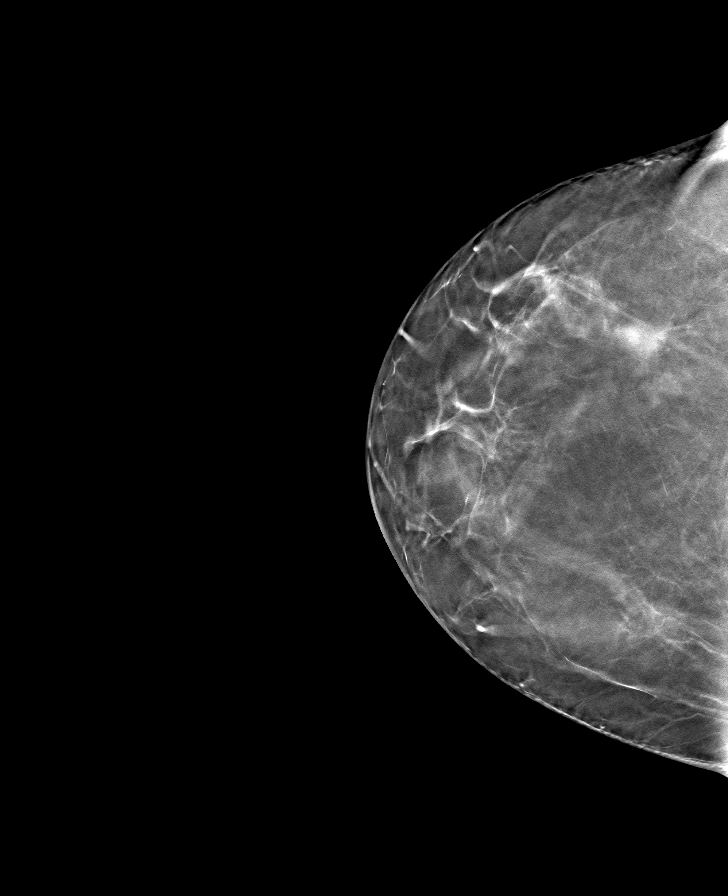

[8 of 24 positions shown; findings below may reference images not displayed]

ACR Breast Density Category b: There are scattered areas of
fibroglandular density.
FINDINGS: There are no findings suspicious for malignancy. Images were
processed with CAD.
IMPRESSION: No mammographic evidence of malignancy. A result letter of this
screening mammogram will be mailed directly to the patient.

RECOMMENDATION:
Screening mammogram in one year. (Code:CN-U-775)

BI-RADS CATEGORY  1: Negative.

## 2020-01-27 ENCOUNTER — Other Ambulatory Visit: Payer: Self-pay | Admitting: Family Medicine

## 2020-01-27 ENCOUNTER — Other Ambulatory Visit (HOSPITAL_COMMUNITY)
Admission: RE | Admit: 2020-01-27 | Discharge: 2020-01-27 | Disposition: A | Discharge status: Home / Self Care | Payer: BC Managed Care – PPO | Source: Ambulatory Visit | Attending: Family Medicine | Admitting: Family Medicine

## 2020-01-27 DIAGNOSIS — Z124 Encounter for screening for malignant neoplasm of cervix: Secondary | ICD-10-CM | POA: Insufficient documentation

## 2020-01-27 DIAGNOSIS — Z1231 Encounter for screening mammogram for malignant neoplasm of breast: Secondary | ICD-10-CM

## 2020-01-31 LAB — CYTOLOGY - PAP: Diagnosis: NEGATIVE

## 2020-03-03 ENCOUNTER — Other Ambulatory Visit: Payer: Self-pay

## 2020-03-03 ENCOUNTER — Ambulatory Visit
Admission: RE | Admit: 2020-03-03 | Discharge: 2020-03-03 | Disposition: A | Discharge status: Home / Self Care | Payer: BC Managed Care – PPO | Source: Ambulatory Visit | Attending: Family Medicine | Admitting: Family Medicine

## 2020-03-03 DIAGNOSIS — Z1231 Encounter for screening mammogram for malignant neoplasm of breast: Secondary | ICD-10-CM

## 2021-01-18 ENCOUNTER — Other Ambulatory Visit: Payer: Self-pay | Admitting: Family Medicine

## 2021-01-18 DIAGNOSIS — Z1231 Encounter for screening mammogram for malignant neoplasm of breast: Secondary | ICD-10-CM

## 2021-03-08 ENCOUNTER — Other Ambulatory Visit: Payer: Self-pay

## 2021-03-08 ENCOUNTER — Ambulatory Visit
Admission: RE | Admit: 2021-03-08 | Discharge: 2021-03-08 | Disposition: A | Discharge status: Home / Self Care | Payer: BC Managed Care – PPO | Source: Ambulatory Visit | Attending: Family Medicine | Admitting: Family Medicine

## 2021-03-08 DIAGNOSIS — Z1231 Encounter for screening mammogram for malignant neoplasm of breast: Secondary | ICD-10-CM

## 2021-11-23 ENCOUNTER — Ambulatory Visit
Admission: RE | Admit: 2021-11-23 | Discharge: 2021-11-23 | Disposition: A | Discharge status: Home / Self Care | Payer: BC Managed Care – PPO | Source: Ambulatory Visit | Attending: Internal Medicine | Admitting: Internal Medicine

## 2021-11-23 ENCOUNTER — Other Ambulatory Visit: Payer: Self-pay | Admitting: Internal Medicine

## 2021-11-23 DIAGNOSIS — M25472 Effusion, left ankle: Secondary | ICD-10-CM

## 2022-01-23 ENCOUNTER — Other Ambulatory Visit: Payer: Self-pay | Admitting: Family Medicine

## 2022-01-23 DIAGNOSIS — Z1231 Encounter for screening mammogram for malignant neoplasm of breast: Secondary | ICD-10-CM

## 2022-03-11 ENCOUNTER — Ambulatory Visit: Payer: BC Managed Care – PPO

## 2022-03-13 ENCOUNTER — Ambulatory Visit: Payer: BC Managed Care – PPO

## 2022-03-14 ENCOUNTER — Ambulatory Visit
Admission: RE | Admit: 2022-03-14 | Discharge: 2022-03-14 | Disposition: A | Discharge status: Home / Self Care | Payer: BC Managed Care – PPO | Source: Ambulatory Visit | Attending: Family Medicine | Admitting: Family Medicine

## 2022-03-14 DIAGNOSIS — Z1231 Encounter for screening mammogram for malignant neoplasm of breast: Secondary | ICD-10-CM

## 2023-01-20 ENCOUNTER — Other Ambulatory Visit: Payer: Self-pay | Admitting: Otolaryngology

## 2023-01-20 DIAGNOSIS — H9122 Sudden idiopathic hearing loss, left ear: Secondary | ICD-10-CM

## 2023-01-29 ENCOUNTER — Other Ambulatory Visit: Payer: Self-pay | Admitting: Internal Medicine

## 2023-01-29 DIAGNOSIS — Z1231 Encounter for screening mammogram for malignant neoplasm of breast: Secondary | ICD-10-CM

## 2023-03-17 ENCOUNTER — Ambulatory Visit
Admission: RE | Admit: 2023-03-17 | Discharge: 2023-03-17 | Disposition: A | Discharge status: Home / Self Care | Payer: BC Managed Care – PPO | Source: Ambulatory Visit | Attending: Internal Medicine | Admitting: Internal Medicine

## 2023-03-17 DIAGNOSIS — Z1231 Encounter for screening mammogram for malignant neoplasm of breast: Secondary | ICD-10-CM

## 2023-11-12 ENCOUNTER — Encounter: Payer: Self-pay | Admitting: Podiatry

## 2023-11-12 ENCOUNTER — Ambulatory Visit: Payer: BC Managed Care – PPO | Admitting: Podiatry

## 2023-11-12 ENCOUNTER — Ambulatory Visit (INDEPENDENT_AMBULATORY_CARE_PROVIDER_SITE_OTHER): Payer: BC Managed Care – PPO

## 2023-11-12 DIAGNOSIS — M7751 Other enthesopathy of right foot: Secondary | ICD-10-CM | POA: Diagnosis not present

## 2023-11-12 DIAGNOSIS — M779 Enthesopathy, unspecified: Secondary | ICD-10-CM

## 2023-11-12 MED ORDER — TRIAMCINOLONE ACETONIDE 10 MG/ML IJ SUSP
10.0000 mg | Freq: Once | INTRAMUSCULAR | Status: AC
  Administered 2023-11-12: 10 mg via INTRA_ARTICULAR

## 2023-11-12 NOTE — Progress Notes (Signed)
Subjective:   Patient ID: [name] Norton, female   DOB: 57 y.o.   MRN: [id number]   HPI Patient presents stating she developed a lot of pain in her second toe joint right and it has been going on for a few months and does not remember specific injury but is very active.  She is getting ready to go to [place] in the next couple weeks and would like to get this better as soon as possible.  Patient does not smoke likes to be active   Review of Systems  All other systems reviewed and are negative.       Objective:  Physical Exam Vitals and nursing note reviewed.  Constitutional:      Appearance: She is well-developed.  Pulmonary:     Effort: Pulmonary effort is normal.  Musculoskeletal:        General: Normal range of motion.  Skin:    General: Skin is warm.  Neurological:     Mental Status: She is alert.     Neurovascular status intact muscle strength found to be adequate range of motion adequate with exquisite discomfort second metatarsal phalangeal joint right with fluid buildup within the joint mild deviation of the second toe with probable compensatory gait pattern.  Good digital perfusion well-oriented     Assessment:  Inflammatory capsulitis second MPJ right cannot rule out subtle flexor plate injury F2 H&P reviewed condition discussed and at this point organ to focus on the inflamed joint now worry currently about flexor plate and less it would continue.  Educated her on this did block of the right forefoot I then did sterile prep of the area aspirated the second MPJ getting out a small amount of clear fluid injected quarter cc dexamethasone Kenalog applied thick med pads along with rigid bottom shoes and reappoint as symptoms indicate       Plan:  The above describes the plan for this patient  X-rays were negative for signs of fracture or bony injury associated with condition

## 2024-02-02 ENCOUNTER — Other Ambulatory Visit: Payer: Self-pay | Admitting: Internal Medicine

## 2024-02-02 DIAGNOSIS — Z1231 Encounter for screening mammogram for malignant neoplasm of breast: Secondary | ICD-10-CM

## 2024-03-18 ENCOUNTER — Ambulatory Visit
Admission: RE | Admit: 2024-03-18 | Discharge: 2024-03-18 | Discharge status: Home / Self Care | Payer: Self-pay | Source: Ambulatory Visit | Attending: Internal Medicine

## 2024-03-18 DIAGNOSIS — Z1231 Encounter for screening mammogram for malignant neoplasm of breast: Secondary | ICD-10-CM

## 2024-03-31 ENCOUNTER — Ambulatory Visit (INDEPENDENT_AMBULATORY_CARE_PROVIDER_SITE_OTHER)

## 2024-03-31 ENCOUNTER — Encounter: Payer: Self-pay | Admitting: Podiatry

## 2024-03-31 ENCOUNTER — Ambulatory Visit: Admitting: Podiatry

## 2024-03-31 VITALS — Ht 63.0 in | Wt 192.0 lb

## 2024-03-31 DIAGNOSIS — M7751 Other enthesopathy of right foot: Secondary | ICD-10-CM | POA: Diagnosis not present

## 2024-03-31 DIAGNOSIS — M7752 Other enthesopathy of left foot: Secondary | ICD-10-CM

## 2024-03-31 MED ORDER — TRIAMCINOLONE ACETONIDE 10 MG/ML IJ SUSP
10.0000 mg | Freq: Once | INTRAMUSCULAR | Status: AC
  Administered 2024-03-31: 10 mg via INTRA_ARTICULAR

## 2024-03-31 NOTE — Progress Notes (Signed)
 Subjective:   Patient ID: [name] Norton, female   DOB: 58 y.o.   MRN: [id number]   HPI Patient states that she did well for a period of time but it has started to hurt her again in her joint over the last couple months and she is very active and still is hiking 6 miles a day   ROS      Objective:  Physical Exam  Neurovascular status intact with inflammation fluid of the second MPJ right foot with what appears to be further elevation of the digit that is gradually been occurring with pressure against the joint itself     Assessment:  Probability for flexor plate dislocation right second MPJ with fluid buildup of the joint surface     Plan:  H&P new x-ray to compare to old x-ray taken and I went ahead today I did a sterile block of the right forefoot I then aspirated the second MPJ getting out a small amount of fluid and injected quarter cc dexamethasone Kenalog and then casted for functional orthotics to offload the weight from the second MPJ and try to reduce the pattern that is occurring.  Patient to be seen back and eventually may require digital fusion with possible osteotomy surgery  X-ray indicates there has been elevation of the 2nd and 3rd digits to a higher degree than what we noted approximate 4 months ago

## 2024-04-27 ENCOUNTER — Ambulatory Visit

## 2024-04-27 NOTE — Progress Notes (Signed)
Patient presents today to pick up custom molded foot orthotics, diagnosed with Capsulitis by Dr. Charlsie Merles.   Orthotics were dispensed and fit was satisfactory. Reviewed instructions for break-in and wear. Written instructions given to patient.  Patient will follow up as needed.   Addison Bailey Cped, CFo, CFm
